# Patient Record
Sex: Male | Born: 1975 | Race: Black or African American | Hispanic: No | Marital: Single | Smoking: Current some day smoker
Health system: Southern US, Community
[De-identification: ages and names within clinical notes are randomized; demographics above are authoritative.]

---

## 1998-07-21 ENCOUNTER — Emergency Department (HOSPITAL_COMMUNITY): Admission: EM | Admit: 1998-07-21 | Discharge: 1998-07-21 | Payer: Self-pay | Admitting: Emergency Medicine

## 1998-07-25 ENCOUNTER — Emergency Department (HOSPITAL_COMMUNITY): Admission: EM | Admit: 1998-07-25 | Discharge: 1998-07-25 | Payer: Self-pay | Admitting: *Deleted

## 1999-05-04 ENCOUNTER — Emergency Department (HOSPITAL_COMMUNITY): Admission: EM | Admit: 1999-05-04 | Discharge: 1999-05-04 | Payer: Self-pay | Admitting: Emergency Medicine

## 2000-04-30 ENCOUNTER — Emergency Department (HOSPITAL_COMMUNITY): Admission: EM | Admit: 2000-04-30 | Discharge: 2000-04-30 | Payer: Self-pay | Admitting: *Deleted

## 2001-07-10 ENCOUNTER — Emergency Department (HOSPITAL_COMMUNITY): Admission: EM | Admit: 2001-07-10 | Discharge: 2001-07-11 | Payer: Self-pay | Admitting: Emergency Medicine

## 2001-07-10 ENCOUNTER — Encounter: Payer: Self-pay | Admitting: Emergency Medicine

## 2001-07-11 ENCOUNTER — Encounter: Payer: Self-pay | Admitting: Emergency Medicine

## 2005-09-11 ENCOUNTER — Emergency Department (HOSPITAL_COMMUNITY): Admission: EM | Admit: 2005-09-11 | Discharge: 2005-09-11 | Payer: Self-pay | Admitting: Emergency Medicine

## 2009-03-24 ENCOUNTER — Emergency Department (HOSPITAL_COMMUNITY): Admission: EM | Admit: 2009-03-24 | Discharge: 2009-03-24 | Payer: Self-pay | Admitting: Emergency Medicine

## 2010-05-24 ENCOUNTER — Emergency Department (HOSPITAL_COMMUNITY): Admission: EM | Admit: 2010-05-24 | Discharge: 2010-05-24 | Payer: Self-pay | Admitting: Emergency Medicine

## 2010-06-06 ENCOUNTER — Encounter: Admission: RE | Admit: 2010-06-06 | Discharge: 2010-06-06 | Payer: Self-pay | Admitting: Chiropractic Medicine

## 2010-09-05 ENCOUNTER — Encounter: Admission: RE | Admit: 2010-09-05 | Discharge: 2010-10-03 | Payer: Self-pay | Admitting: Orthopedic Surgery

## 2013-01-25 ENCOUNTER — Emergency Department (HOSPITAL_COMMUNITY)
Admission: EM | Admit: 2013-01-25 | Discharge: 2013-01-25 | Disposition: A | Discharge status: Home / Self Care | Payer: Self-pay | Attending: Emergency Medicine | Admitting: Emergency Medicine

## 2013-01-25 ENCOUNTER — Emergency Department (HOSPITAL_COMMUNITY): Payer: Self-pay

## 2013-01-25 ENCOUNTER — Encounter (HOSPITAL_COMMUNITY): Payer: Self-pay | Admitting: *Deleted

## 2013-01-25 DIAGNOSIS — J069 Acute upper respiratory infection, unspecified: Secondary | ICD-10-CM | POA: Insufficient documentation

## 2013-01-25 DIAGNOSIS — R05 Cough: Secondary | ICD-10-CM

## 2013-01-25 DIAGNOSIS — R059 Cough, unspecified: Secondary | ICD-10-CM | POA: Insufficient documentation

## 2013-01-25 MED ORDER — OSELTAMIVIR PHOSPHATE 75 MG PO CAPS: 75.0000 mg | ORAL_CAPSULE | Freq: Two times a day (BID) | ORAL | Status: DC

## 2013-01-25 MED ORDER — IBUPROFEN 400 MG PO TABS
600.0000 mg | ORAL_TABLET | Freq: Once | ORAL | Status: AC
  Administered 2013-01-25: 600 mg via ORAL
  Filled 2013-01-25: qty 1

## 2013-01-25 MED ORDER — HYDROCOD POLST-CHLORPHEN POLST 10-8 MG/5ML PO LQCR: 5.0000 mL | Freq: Two times a day (BID) | ORAL | Status: DC | PRN

## 2013-01-25 NOTE — ED Notes (Signed)
I gave the patient a warm blanket. 

## 2013-01-25 NOTE — ED Provider Notes (Addendum)
History     CSN: [id number]  Arrival date & time 01/25/13  1132   First MD Initiated Contact with Patient 01/25/13 1427      Chief Complaint  Patient presents with  . Fever  . Cough    (Consider location/radiation/quality/duration/timing/severity/associated sxs/prior treatment) Patient is a 37 y.o. male presenting with fever and cough. The history is provided by the patient.  Fever Associated symptoms: congestion, cough and rhinorrhea   Associated symptoms: no chest pain, no confusion, no dysuria and no rash   Cough Associated symptoms: fever and rhinorrhea   Associated symptoms: no chest pain, no eye discharge, no rash and no shortness of breath   pt c/o productive cough, fever for the past day. +scratchy throat, no difficulty breathing or swallowing. +nasal congestion. Body aches. On and off mild headaches. No chills. No known ill contacts. No hx asthma or chronic lung disease. No hx diabetes. Is eating and drinking normally. No nv. No gu c/o. No rash. Severe headaches or neck pain.     History reviewed. No pertinent past medical history.  History reviewed. No pertinent past surgical history.  History reviewed. No pertinent family history.  History  Substance Use Topics  . Smoking status: Not on file  . Smokeless tobacco: Not on file  . Alcohol Use: Yes     Comment: occ      Review of Systems  Constitutional: Positive for fever.  HENT: Positive for congestion and rhinorrhea. Negative for neck pain and neck stiffness.   Eyes: Negative for pain, discharge and redness.  Respiratory: Positive for cough. Negative for shortness of breath.   Cardiovascular: Negative for chest pain.  Gastrointestinal: Negative for abdominal pain.  Genitourinary: Negative for dysuria and flank pain.  Musculoskeletal: Negative for back pain.  Skin: Negative for rash.  Neurological: Negative for weakness and numbness.  Hematological: Does not bruise/bleed easily.  Psychiatric/Behavioral:  Negative for confusion.    Allergies  Onion  Home Medications   Current Outpatient Rx  Name  Route  Sig  Dispense  Refill  . acetaminophen (TYLENOL) 500 MG tablet   Oral   Take 500 mg by mouth every 6 (six) hours as needed for pain.         . Chlorphen-Phenyleph-ASA (ALKA-SELTZER PLUS COLD PO)   Oral   Take 2 capsules by mouth every 4 (four) hours as needed (for cold and sinus).           BP 141/77  Pulse 76  Temp(Src) 99.5 F (37.5 C) (Oral)  Resp 18  SpO2 96%  Physical Exam  Nursing note and vitals reviewed. Constitutional: He is oriented to person, place, and time. He appears well-developed and well-nourished. No distress.  HENT:  Head: Atraumatic.  Mouth/Throat: Oropharynx is clear and moist.  Mild nasal congestion  Eyes: Conjunctivae are normal. Pupils are equal, round, and reactive to light. No scleral icterus.  Neck: Neck supple. No tracheal deviation present.  No stiffness or rigidity  Cardiovascular: Normal rate, regular rhythm, normal heart sounds and intact distal pulses.   Pulmonary/Chest: Effort normal and breath sounds normal. No accessory muscle usage. No respiratory distress.  Abdominal: Soft. Bowel sounds are normal. He exhibits no distension. There is no tenderness.  Genitourinary:  No cva tenderness  Musculoskeletal: Normal range of motion. He exhibits no edema and no tenderness.  Lymphadenopathy:    He has no cervical adenopathy.  Neurological: He is alert and oriented to person, place, and time.  Skin: Skin is warm and  dry. No rash noted.  Psychiatric: He has a normal mood and affect.    ED Course  Procedures (including critical care time)   No results found for this or any previous visit. Dg Chest 2 View  01/25/2013  *RADIOLOGY REPORT*  Clinical Data: Cough and fever  CHEST - 2 VIEW  Comparison: None.  Findings: The heart is normal in size.  Lungs are clear.  No pneumothorax or pleural effusion.  IMPRESSION: No active cardiopulmonary  disease.   Original Report Authenticated By: Jolaine Click, M.D.       MDM  Cxr.  Motrin po.  Reviewed nursing notes and prior charts for additional history.    cxr neg. No increased wob. Pulse ox 96%, pt stable for d/c.   Given symptom onset in past day, will give rx tamiflu.    Pt/family requests rx for prescription cough medication.     Suzi Roots, MD 01/25/13 1547  Suzi Roots, MD 01/25/13 [phone]

## 2013-01-25 NOTE — ED Notes (Signed)
Pt reports not feeling well since Thursday, reports fevers, chills, productive cough and had n/v x 1. No acute distress noted at triage.

## 2018-11-04 ENCOUNTER — Emergency Department (HOSPITAL_COMMUNITY)
Admission: EM | Admit: 2018-11-04 | Discharge: 2018-11-04 | Disposition: A | Discharge status: Home / Self Care | Payer: Self-pay | Attending: Emergency Medicine | Admitting: Emergency Medicine

## 2018-11-04 ENCOUNTER — Other Ambulatory Visit: Payer: Self-pay

## 2018-11-04 DIAGNOSIS — Y999 Unspecified external cause status: Secondary | ICD-10-CM | POA: Insufficient documentation

## 2018-11-04 DIAGNOSIS — Y92410 Unspecified street and highway as the place of occurrence of the external cause: Secondary | ICD-10-CM | POA: Insufficient documentation

## 2018-11-04 DIAGNOSIS — S39012A Strain of muscle, fascia and tendon of lower back, initial encounter: Secondary | ICD-10-CM | POA: Insufficient documentation

## 2018-11-04 DIAGNOSIS — Y9389 Activity, other specified: Secondary | ICD-10-CM | POA: Insufficient documentation

## 2018-11-04 DIAGNOSIS — M542 Cervicalgia: Secondary | ICD-10-CM | POA: Insufficient documentation

## 2018-11-04 MED ORDER — NAPROXEN 250 MG PO TABS
500.0000 mg | ORAL_TABLET | Freq: Once | ORAL | Status: AC
  Administered 2018-11-04: 500 mg via ORAL
  Filled 2018-11-04: qty 2

## 2018-11-04 MED ORDER — METHOCARBAMOL 500 MG PO TABS: 500.0000 mg | ORAL_TABLET | Freq: Three times a day (TID) | ORAL | 0 refills | Status: DC | PRN

## 2018-11-04 MED ORDER — NAPROXEN 500 MG PO TABS: 500.0000 mg | ORAL_TABLET | Freq: Two times a day (BID) | ORAL | 0 refills | Status: AC | PRN

## 2018-11-04 NOTE — Discharge Instructions (Addendum)
Alternate ice and heat to areas of injury 3-4 times per day to limit inflammation and spasm.  Avoid strenuous activity and heavy lifting.  We recommend consistent use of naproxen in addition to Robaxin for muscle spasms. Do not drive or drink alcohol after taking Robaxin as it may make you drowsy and impair your judgment.  We recommend follow-up with a primary care doctor to ensure resolution of symptoms.  Return to the ED for any new or concerning symptoms. 
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
## 2019-10-29 ENCOUNTER — Encounter (HOSPITAL_BASED_OUTPATIENT_CLINIC_OR_DEPARTMENT_OTHER): Payer: Self-pay | Admitting: *Deleted

## 2019-10-29 ENCOUNTER — Emergency Department (HOSPITAL_BASED_OUTPATIENT_CLINIC_OR_DEPARTMENT_OTHER)
Admission: EM | Admit: 2019-10-29 | Discharge: 2019-10-29 | Disposition: A | Discharge status: Home / Self Care | Payer: Self-pay | Attending: Emergency Medicine | Admitting: Emergency Medicine

## 2019-10-29 ENCOUNTER — Other Ambulatory Visit: Payer: Self-pay

## 2019-10-29 ENCOUNTER — Emergency Department (HOSPITAL_BASED_OUTPATIENT_CLINIC_OR_DEPARTMENT_OTHER): Payer: Self-pay

## 2019-10-29 DIAGNOSIS — Y999 Unspecified external cause status: Secondary | ICD-10-CM | POA: Insufficient documentation

## 2019-10-29 DIAGNOSIS — Y929 Unspecified place or not applicable: Secondary | ICD-10-CM | POA: Insufficient documentation

## 2019-10-29 DIAGNOSIS — W1842XA Slipping, tripping and stumbling without falling due to stepping into hole or opening, initial encounter: Secondary | ICD-10-CM | POA: Insufficient documentation

## 2019-10-29 DIAGNOSIS — S93402A Sprain of unspecified ligament of left ankle, initial encounter: Secondary | ICD-10-CM | POA: Insufficient documentation

## 2019-10-29 DIAGNOSIS — Y9301 Activity, walking, marching and hiking: Secondary | ICD-10-CM | POA: Insufficient documentation

## 2019-10-29 DIAGNOSIS — F17290 Nicotine dependence, other tobacco product, uncomplicated: Secondary | ICD-10-CM | POA: Insufficient documentation

## 2019-10-29 DIAGNOSIS — Z91018 Allergy to other foods: Secondary | ICD-10-CM | POA: Insufficient documentation

## 2019-10-29 NOTE — ED Provider Notes (Signed)
[facility] EMERGENCY DEPARTMENT Provider Note   CSN: [id number] Arrival date & time: 10/29/19  1928     History   Chief Complaint Chief Complaint  Patient presents with  . Ankle Injury    HPI Joseph Roach is a 43 y.o. male who presents to the ED today complaining of sudden onset, constant, achy/throbbing, left ankle pain that occurred early this morning.  She reports that he was walking when he stepped into a 4 to 5 inch hole in the ground twisting his ankle in the process and causing pain to the area.  Patient states that he was trying to take care of at home by himself at home and taking ibuprofen without relief, and this prompted his ED visit today.  No previous injury to this ankle.  Patient did not fall or hit head during the fall.  No loss of consciousness.  Denies fever, chills, redness, warmth to the joint, weakness, numbness, any other associated symptoms.        History reviewed. No pertinent past medical history.  There are no active problems to display for this patient.   History reviewed. No pertinent surgical history.      Home Medications    Prior to Admission medications   Medication Sig Start Date End Date Taking? Authorizing Provider  acetaminophen (TYLENOL) 500 MG tablet Take 500 mg by mouth every 6 (six) hours as needed for pain.    [provider]  Chlorphen-Phenyleph-ASA (ALKA-SELTZER PLUS COLD PO) Take 2 capsules by mouth every 4 (four) hours as needed (for cold and sinus).    [provider]  naproxen (NAPROSYN) 500 MG tablet Take 1 tablet (500 mg total) by mouth every 12 (twelve) hours as needed for mild pain or moderate pain. 11/04/18   Antony Madura, PA-C    Family History No family history on file.  Social History Social History   Tobacco Use  . Smoking status: Current Some Day Smoker    Types: Cigars  . Smokeless tobacco: Never Used  Substance Use Topics  . Alcohol use: Yes    Comment: occ  . Drug use:  Yes    Types: Marijuana     Allergies   Onion   Review of Systems Review of Systems  Constitutional: Negative for chills and fever.  Musculoskeletal: Positive for arthralgias.  Skin: Negative for wound.     Physical Exam Updated Vital Signs BP 120/79 (BP Location: Right Arm)   Pulse 80   Temp 99.3 F (37.4 C) (Oral)   Resp 18   Ht 5\' 11"  (1.803 m)   Wt 72.6 kg   SpO2 98%   BMI 22.32 kg/m   Physical Exam Vitals signs and nursing note reviewed.  Constitutional:      Appearance: He is not ill-appearing or diaphoretic.     Comments: Sleeping comfortably prior to exam.  HENT:     Head: Normocephalic and atraumatic.  Eyes:     Conjunctiva/sclera: Conjunctivae normal.  Cardiovascular:     Rate and Rhythm: Normal rate and regular rhythm.     Pulses: Normal pulses.  Pulmonary:     Effort: Pulmonary effort is normal.     Breath sounds: Normal breath sounds. No wheezing, rhonchi or rales.  Musculoskeletal:     Comments: No obvious swelling noted to left ankle compared to right. No increased warmth or erythema to the joint. Tenderness to palpation diffusely throughout ankle joint. No tenderness to foot. ROM limited due to pain. NVI. 2+  DP and PT pulses.   Skin:    General: Skin is warm and dry.     Coloration: Skin is not jaundiced.  Neurological:     Mental Status: He is alert.      ED Treatments / Results  Labs (all labs ordered are listed, but only abnormal results are displayed) Labs Reviewed - No data to display  EKG None  Radiology Dg Ankle Complete Left  Result Date: 10/29/2019 CLINICAL DATA:  Fall, ankle pain EXAM: LEFT ANKLE COMPLETE - 3+ VIEW COMPARISON:  None. FINDINGS: There is no evidence of fracture, dislocation, or joint effusion. There is no evidence of arthropathy or other focal bone abnormality. Soft tissues are unremarkable. IMPRESSION: Negative. Electronically Signed   By: Rolm Baptise M.D.   On: 10/29/2019 20:06    Procedures Procedures  (including critical care time)  Medications Ordered in ED Medications - No data to display   Initial Impression / Assessment and Plan / ED Course  I have reviewed the triage vital signs and the nursing notes.  Pertinent labs & imaging results that were available during my care of the patient were reviewed by me and considered in my medical decision making (see chart for details).    43 year old male presents to the ED today after sustaining left ankle pain with inversion of ankle and small hole.  No obvious swelling noted to the ankle on exam.  No erythema or increased warmth.  No concern for septic joint at this time.  Vital signs stable today.  Patient afebrile without tachycardia or tachypnea.  He is sleeping comfortably prior to exam and easily arousable.  X-ray was obtained prior to being seen -no acute abnormalities today.  Patient does note pain with ambulation.  Will give crutches in the ED and ASO brace for comfort.  Patient advised to follow-up with sports medicine for further evaluation as he may have ligamentous injury.  Patient advised to take ibuprofen and Tylenol as needed for pain.  Rice therapy has been discussed with patient as well.  Patient is in agreement with plan at this time is stable for discharge home.   This note was prepared using Dragon voice recognition software and may include unintentional dictation errors due to the inherent limitations of voice recognition software.      Final Clinical Impressions(s) / ED Diagnoses   Final diagnoses:  Sprain of left ankle, unspecified ligament, initial encounter    ED Discharge Orders    None       Eustaquio Maize, PA-C 10/29/19 2128    Fredia Sorrow, MD 11/02/19 [phone]

## 2019-10-29 NOTE — Discharge Instructions (Signed)
Your xray was negative for any fractures today. Please use crutches and ankle brace for comfort. You may take 800 mg Ibuprofen every 8 hours as well as Tylenol 1,000 mg every 8 hours as needed for pain. While at home please elevate ankle on 2-3 pillows and apply ice to reduce swelling. Please follow up with sports medicine Dr. Raeford Razor for further evaluation.

## 2019-10-29 NOTE — ED Triage Notes (Signed)
Pt c/o left ankle injury this am  

## 2021-08-03 IMAGING — DX DG ANKLE COMPLETE 3+V*L*
3 series · 3 of 3 positions shown · non-contrast
Comparison: None.

CLINICAL DATA: Fall, ankle pain

EXAM:
LEFT ANKLE COMPLETE - 3+ VIEW

[ankle ap]
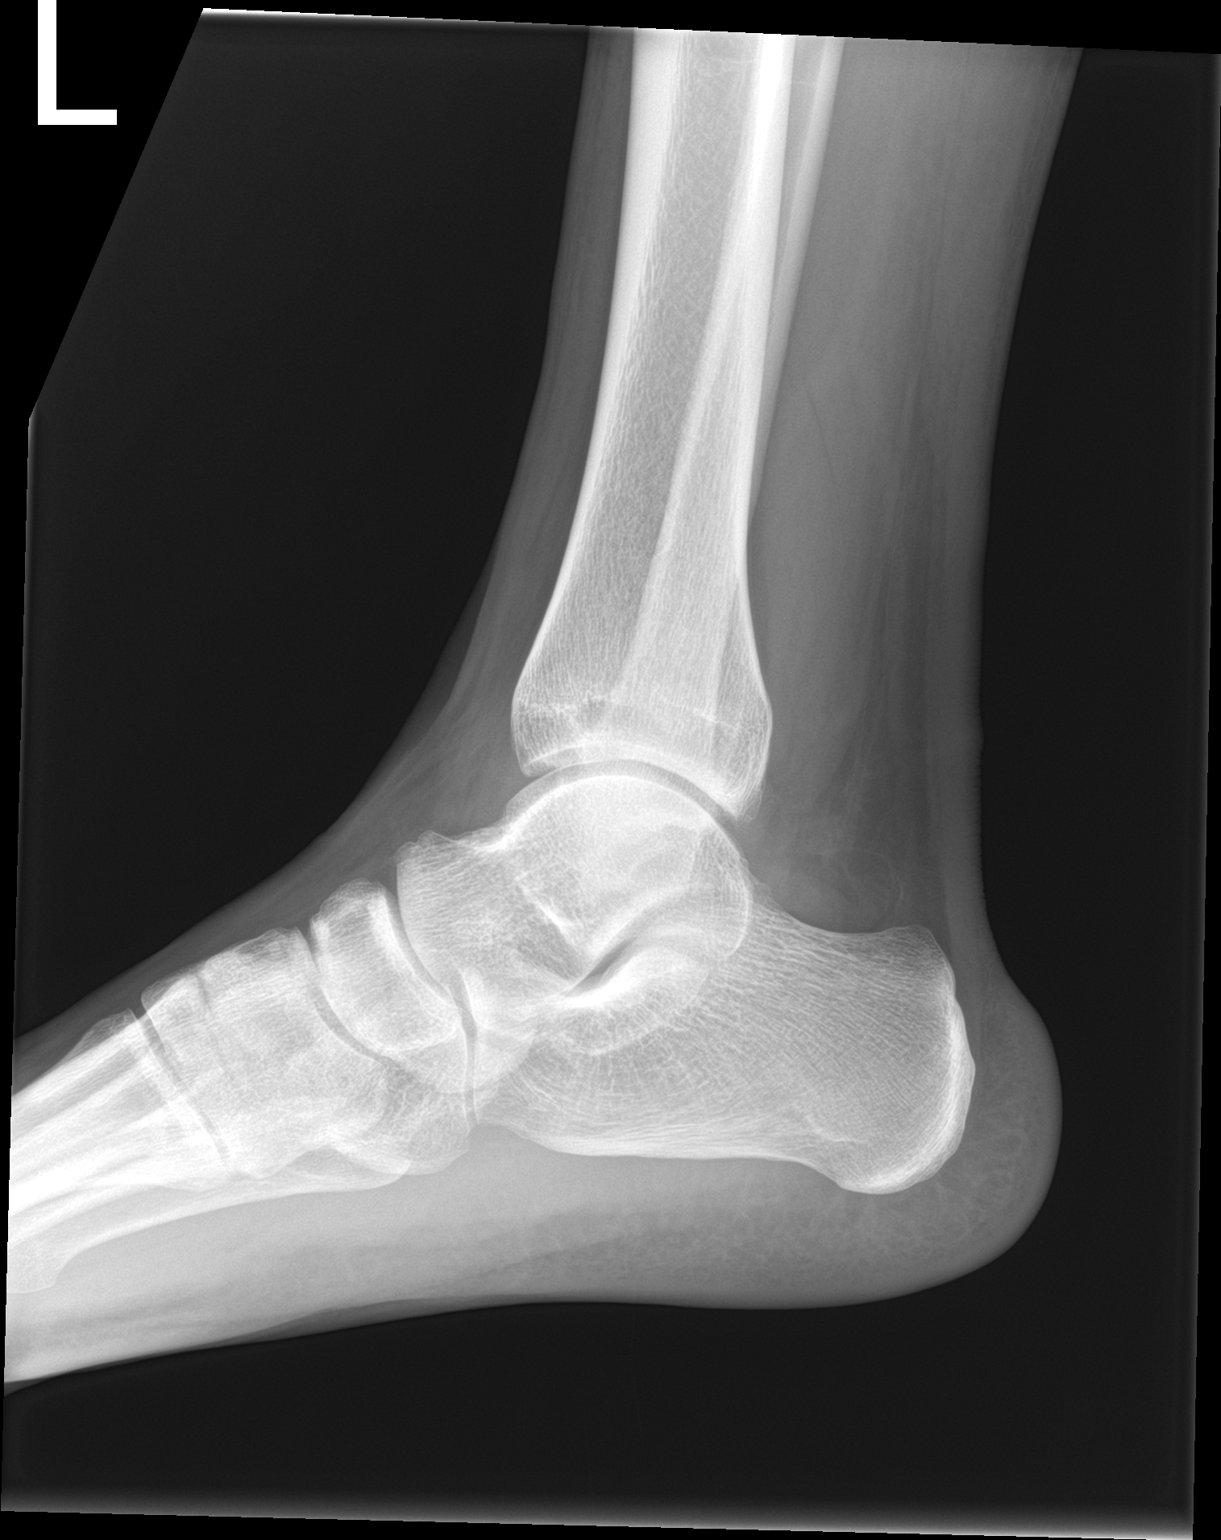

[ankle obl]
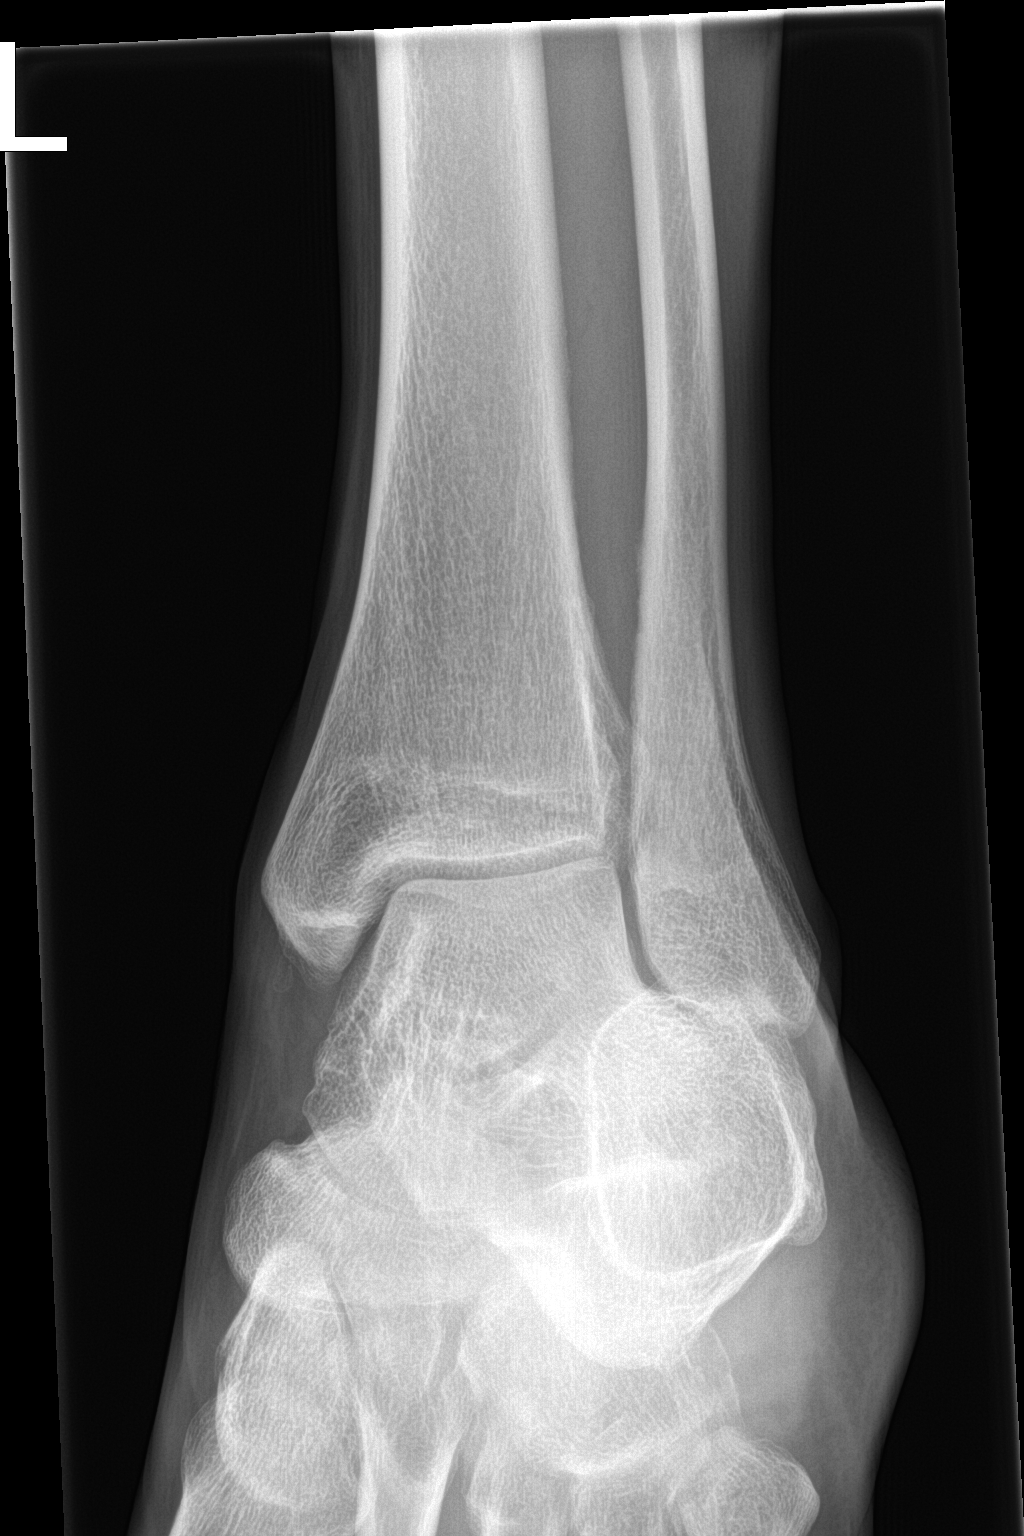

[ankle lat]
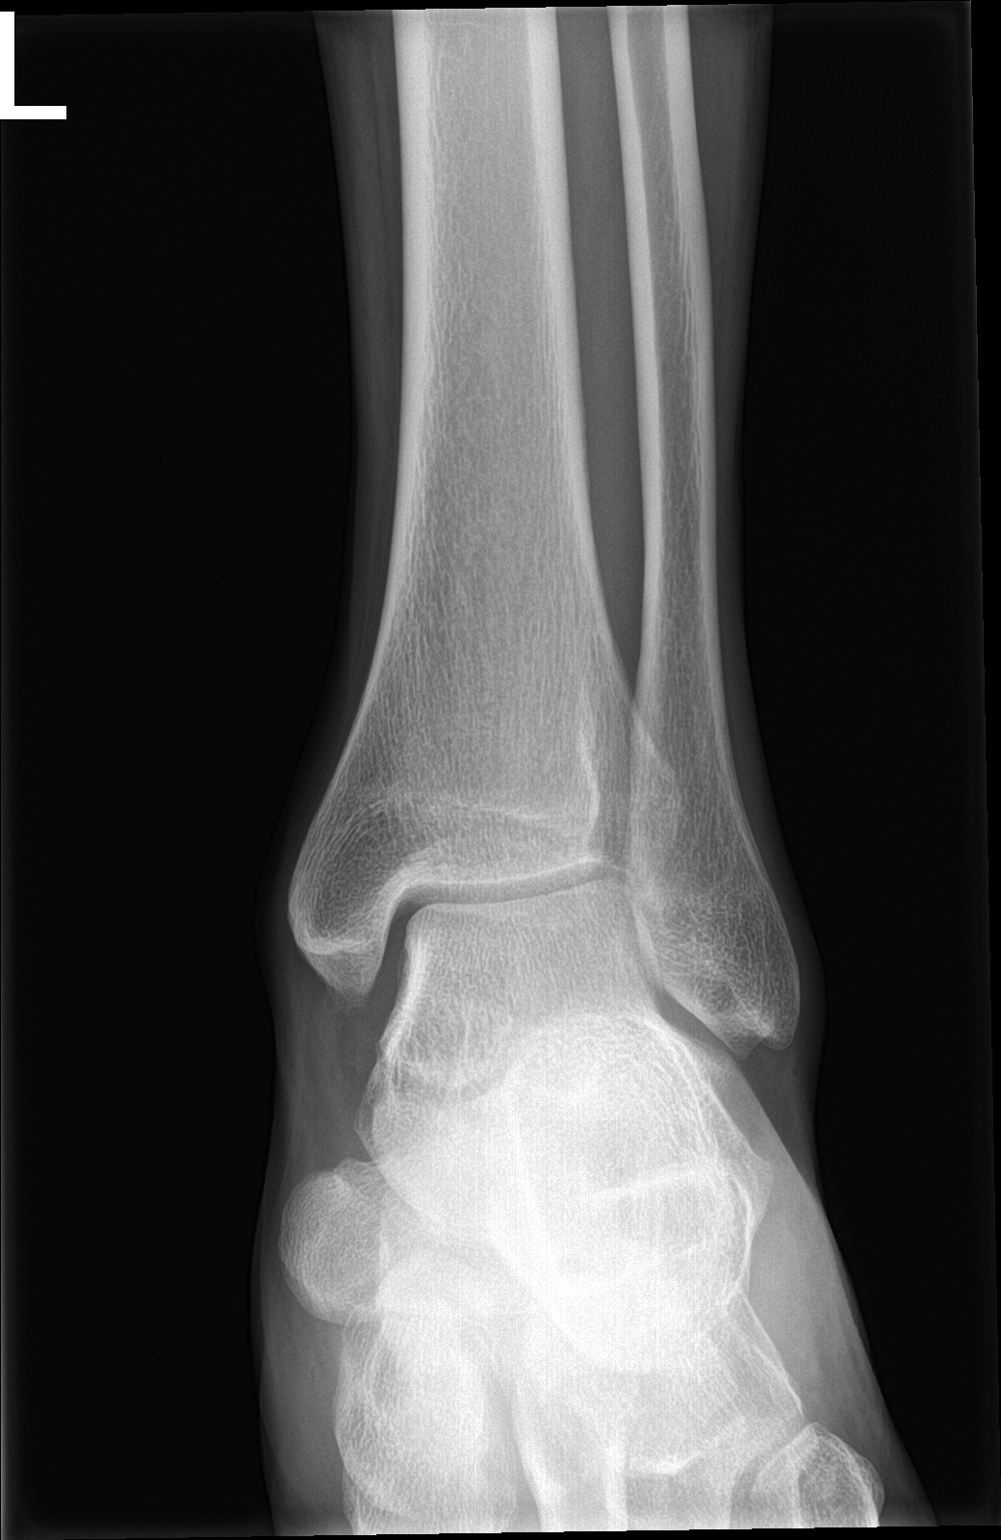

[3 of 3 positions shown; findings below may reference images not displayed]

FINDINGS: There is no evidence of fracture, dislocation, or joint effusion.
There is no evidence of arthropathy or other focal bone abnormality.
Soft tissues are unremarkable.
IMPRESSION: Negative.
